# Patient Record
Sex: Female | Born: 1965 | Race: Black or African American | Hispanic: No | Marital: Single | Smoking: Never smoker
Health system: Southern US, Community
[De-identification: ages and names within clinical notes are randomized; demographics above are authoritative.]

## PROBLEM LIST (undated history)

## (undated) DIAGNOSIS — F419 Anxiety disorder, unspecified: Secondary | ICD-10-CM

## (undated) DIAGNOSIS — I10 Essential (primary) hypertension: Secondary | ICD-10-CM

---

## 2020-10-13 ENCOUNTER — Other Ambulatory Visit: Payer: Self-pay

## 2020-10-13 ENCOUNTER — Encounter: Payer: Self-pay | Admitting: Emergency Medicine

## 2020-10-13 ENCOUNTER — Ambulatory Visit
Admission: EM | Admit: 2020-10-13 | Discharge: 2020-10-13 | Disposition: A | Discharge status: Home / Self Care | Payer: 59 | Attending: Orthopedic Surgery | Admitting: Orthopedic Surgery

## 2020-10-13 DIAGNOSIS — Z79899 Other long term (current) drug therapy: Secondary | ICD-10-CM | POA: Insufficient documentation

## 2020-10-13 DIAGNOSIS — I10 Essential (primary) hypertension: Secondary | ICD-10-CM | POA: Insufficient documentation

## 2020-10-13 DIAGNOSIS — Z880 Allergy status to penicillin: Secondary | ICD-10-CM | POA: Insufficient documentation

## 2020-10-13 DIAGNOSIS — F419 Anxiety disorder, unspecified: Secondary | ICD-10-CM | POA: Diagnosis not present

## 2020-10-13 DIAGNOSIS — R197 Diarrhea, unspecified: Secondary | ICD-10-CM | POA: Insufficient documentation

## 2020-10-13 DIAGNOSIS — Z20822 Contact with and (suspected) exposure to covid-19: Secondary | ICD-10-CM | POA: Insufficient documentation

## 2020-10-13 DIAGNOSIS — R0981 Nasal congestion: Secondary | ICD-10-CM | POA: Insufficient documentation

## 2020-10-13 HISTORY — DX: Anxiety disorder, unspecified: F41.9

## 2020-10-13 HISTORY — DX: Essential (primary) hypertension: I10

## 2020-10-13 MED ORDER — CLARITIN-D 24 HOUR 10-240 MG PO TB24: 1.0000 | ORAL_TABLET | Freq: Every day | ORAL | 0 refills | Status: AC

## 2020-10-13 NOTE — ED Triage Notes (Signed)
Patient states that she ate [place] food yesterday evening and woke up this morning with stomach pain and loose stools.  Patient also reports some sinus drainage.  Patient denies fevers.

## 2020-10-13 NOTE — ED Provider Notes (Signed)
[facility]    CSN: [id number] Arrival date & time: 10/13/20  6433      History   Chief Complaint Chief Complaint  Patient presents with  . Abdominal Pain  . Sinus Problem    HPI Kelsey Frazier is a 54 y.o. female presents to the urgent care facility for evaluation of abdominal pain/diarrhea.  Patient states she had [place] food last night, woke up this morning with some loose stools x2.  Denies any blood, nausea or vomiting.  No fevers.  After 2 episodes of diarrhea she is without any abdominal pain and has been able to tolerate p.o. well this morning.  She is concerned about a possible MSG allergy.  She also complains of some sinus congestion that she has had for years, recently increased.  No fevers, sinus pain.  She uses Flonase as needed.  No cough, chest pain or shortness of breath HPI  Past Medical History:  Diagnosis Date  . Anxiety   . Hypertension     There are no problems to display for this patient.   History reviewed. No pertinent surgical history.  OB History   No obstetric history on file.      Home Medications    Prior to Admission medications   Medication Sig Start Date End Date Taking? Authorizing Provider  busPIRone (BUSPAR) 7.5 MG tablet 1/2 tab BID/prn 09/04/20  Yes [provider]  cetirizine (ZYRTEC) 10 MG tablet Take by mouth. 05/09/20  Yes [provider]  fluticasone (FLONASE) 50 MCG/ACT nasal spray One to two sprays in each nostril once daily. 04/20/14  Yes [provider]  metoprolol succinate (TOPROL-XL) 25 MG 24 hr tablet Take by mouth. 01/31/20  Yes [provider]  loratadine-pseudoephedrine (CLARITIN-D 24 HOUR) 10-240 MG 24 hr tablet Take 1 tablet by mouth daily for 7 days. 10/13/20 10/20/20  Evon Slack, PA-C    Family History History reviewed. No pertinent family history.  Social History Social History   Tobacco Use  . Smoking status: Never Smoker  . Smokeless tobacco: Never  Used  Vaping Use  . Vaping Use: Never used  Substance Use Topics  . Alcohol use: Never  . Drug use: Never     Allergies   Penicillins   Review of Systems Review of Systems  Constitutional: Negative for chills and fever.  HENT: Positive for rhinorrhea. Negative for sinus pressure, sinus pain, sore throat and trouble swallowing.   Respiratory: Negative for shortness of breath.   Cardiovascular: Negative for chest pain.  Gastrointestinal: Positive for diarrhea. Negative for abdominal distention, abdominal pain, blood in stool, constipation, nausea and vomiting.  Genitourinary: Negative for dysuria.  Skin: Negative for rash and wound.  Neurological: Negative for dizziness and headaches.     Physical Exam Triage Vital Signs ED Triage Vitals  Enc Vitals Group     BP 10/13/20 0847 (!) 154/76     Pulse Rate 10/13/20 0847 90     Resp 10/13/20 0847 14     Temp 10/13/20 0847 98.7 F (37.1 C)     Temp Source 10/13/20 0847 Oral     SpO2 10/13/20 0847 100 %     Weight 10/13/20 0842 162 lb (73.5 kg)     Height 10/13/20 0842 5' (1.524 m)     Head Circumference --      Peak Flow --      Pain Score 10/13/20 0842 0     Pain Loc --      Pain  Edu? --      Excl. in GC? --    No data found.  Updated Vital Signs BP (!) 154/76 (BP Location: Right Arm)   Pulse 90   Temp 98.7 F (37.1 C) (Oral)   Resp 14   Ht 5' (1.524 m)   Wt 162 lb (73.5 kg)   SpO2 100%   BMI 31.64 kg/m   Visual Acuity Right Eye Distance:   Left Eye Distance:   Bilateral Distance:    Right Eye Near:   Left Eye Near:    Bilateral Near:     Physical Exam Constitutional:      Appearance: She is well-developed.  HENT:     Head: Normocephalic and atraumatic.     Comments: No sinus tenderness with palpation.    Mouth/Throat:     Pharynx: No oropharyngeal exudate or posterior oropharyngeal erythema.  Eyes:     Conjunctiva/sclera: Conjunctivae normal.  Cardiovascular:     Rate and Rhythm: Normal rate.       Pulses: Normal pulses.     Heart sounds: Normal heart sounds. No murmur heard.   Pulmonary:     Effort: Pulmonary effort is normal. No respiratory distress.  Abdominal:     General: Abdomen is flat. Bowel sounds are normal. There is no distension.     Tenderness: There is no abdominal tenderness. There is no guarding.  Musculoskeletal:        General: Normal range of motion.     Cervical back: Normal range of motion.  Skin:    General: Skin is warm.     Findings: No rash.  Neurological:     Mental Status: She is alert and oriented to person, place, and time.  Psychiatric:        Behavior: Behavior normal.        Thought Content: Thought content normal.      UC Treatments / Results  Labs (all labs ordered are listed, but only abnormal results are displayed) Labs Reviewed  SARS CORONAVIRUS 2 (TAT 6-24 HRS)    EKG   Radiology No results found.  Procedures Procedures (including critical care time)  Medications Ordered in UC Medications - No data to display  Initial Impression / Assessment and Plan / UC Course  I have reviewed the triage vital signs and the nursing notes.  Pertinent labs & imaging results that were available during my care of the patient were reviewed by me and considered in my medical decision making (see chart for details).    54 year old female with a few episodes of diarrhea this morning with abdominal cramping after eating [place] food last night.  She is not having any fevers or blood in her stool.  She has had resolution of her abdominal discomfort and diarrhea.  No nausea or vomiting.  Otherwise feeling well.  Educated patient that we did not do MSG allergy testing here.  I reassured her that her history, exam does not seem to be consistent with any infectious process and we educated her on signs and symptoms to return to the urgent care or ER for.  In regards to her chronic nasal congestion that has increased recently.  I did put her on a  Claritin-D tablet for 1 week.  She understands signs symptoms return to the clinic for Final Clinical Impressions(s) / UC Diagnoses   Final diagnoses:  Diarrhea, unspecified type  Sinus congestion     Discharge Instructions     Please take Claritin-D as prescribed  for 7 days.  Make sure you are drinking lots of fluids.  Return to the ER for any fevers worsening pain, worsening diarrhea nausea vomiting or any urgent changes in your health   ED Prescriptions    Medication Sig Dispense Auth. Provider   loratadine-pseudoephedrine (CLARITIN-D 24 HOUR) 10-240 MG 24 hr tablet Take 1 tablet by mouth daily for 7 days. 7 tablet Evon Slack, PA-C     PDMP not reviewed this encounter.   Evon Slack, [place] 10/13/20 [phone]

## 2020-10-13 NOTE — Discharge Instructions (Addendum)
Please take Claritin-D as prescribed for 7 days.  Make sure you are drinking lots of fluids.  Return to the ER for any fevers worsening pain, worsening diarrhea nausea vomiting or any urgent changes in your health

## 2020-10-14 LAB — SARS CORONAVIRUS 2 (TAT 6-24 HRS): SARS Coronavirus 2: NEGATIVE

## 2020-10-17 ENCOUNTER — Telehealth: Payer: Self-pay

## 2020-10-17 NOTE — Telephone Encounter (Signed)
Negative COVID results given. Patient results "NOT Detected." Caller expressed understanding. ° °

## 2020-11-12 ENCOUNTER — Emergency Department: Payer: 59

## 2020-11-12 ENCOUNTER — Emergency Department
Admission: EM | Admit: 2020-11-12 | Discharge: 2020-11-13 | Disposition: A | Discharge status: Home / Self Care | Payer: 59 | Attending: Emergency Medicine | Admitting: Emergency Medicine

## 2020-11-12 ENCOUNTER — Encounter: Payer: Self-pay | Admitting: Emergency Medicine

## 2020-11-12 ENCOUNTER — Other Ambulatory Visit: Payer: Self-pay

## 2020-11-12 ENCOUNTER — Ambulatory Visit
Admission: EM | Admit: 2020-11-12 | Discharge: 2020-11-12 | Disposition: A | Discharge status: Home / Self Care | Payer: 59 | Attending: Emergency Medicine | Admitting: Emergency Medicine

## 2020-11-12 ENCOUNTER — Encounter: Payer: Self-pay | Admitting: *Deleted

## 2020-11-12 DIAGNOSIS — R509 Fever, unspecified: Secondary | ICD-10-CM | POA: Diagnosis not present

## 2020-11-12 DIAGNOSIS — Z20822 Contact with and (suspected) exposure to covid-19: Secondary | ICD-10-CM | POA: Diagnosis not present

## 2020-11-12 DIAGNOSIS — Z79899 Other long term (current) drug therapy: Secondary | ICD-10-CM | POA: Insufficient documentation

## 2020-11-12 DIAGNOSIS — R002 Palpitations: Secondary | ICD-10-CM | POA: Diagnosis not present

## 2020-11-12 DIAGNOSIS — R739 Hyperglycemia, unspecified: Secondary | ICD-10-CM | POA: Diagnosis not present

## 2020-11-12 DIAGNOSIS — R319 Hematuria, unspecified: Secondary | ICD-10-CM | POA: Diagnosis not present

## 2020-11-12 DIAGNOSIS — I10 Essential (primary) hypertension: Secondary | ICD-10-CM | POA: Diagnosis not present

## 2020-11-12 DIAGNOSIS — R9431 Abnormal electrocardiogram [ECG] [EKG]: Secondary | ICD-10-CM | POA: Diagnosis not present

## 2020-11-12 LAB — URINALYSIS, COMPLETE (UACMP) WITH MICROSCOPIC
Bacteria, UA: NONE SEEN
Bilirubin Urine: NEGATIVE
Glucose, UA: NEGATIVE mg/dL
Ketones, ur: NEGATIVE mg/dL
Leukocytes,Ua: NEGATIVE
Nitrite: NEGATIVE
Protein, ur: NEGATIVE mg/dL
Specific Gravity, Urine: 1.012 (ref 1.005–1.030)
pH: 5 (ref 5.0–8.0)

## 2020-11-12 LAB — CBC
HCT: 41.6 % (ref 36.0–46.0)
Hemoglobin: 13.9 g/dL (ref 12.0–15.0)
MCH: 30.3 pg (ref 26.0–34.0)
MCHC: 33.4 g/dL (ref 30.0–36.0)
MCV: 90.6 fL (ref 80.0–100.0)
Platelets: 263 10*3/uL (ref 150–400)
RBC: 4.59 MIL/uL (ref 3.87–5.11)
RDW: 12.5 % (ref 11.5–15.5)
WBC: 8.5 10*3/uL (ref 4.0–10.5)
nRBC: 0 % (ref 0.0–0.2)

## 2020-11-12 LAB — BASIC METABOLIC PANEL
Anion gap: 9 (ref 5–15)
BUN: 9 mg/dL (ref 6–20)
CO2: 25 mmol/L (ref 22–32)
Calcium: 9.1 mg/dL (ref 8.9–10.3)
Chloride: 103 mmol/L (ref 98–111)
Creatinine, Ser: 0.6 mg/dL (ref 0.44–1.00)
GFR, Estimated: >60 mL/min (ref >60)
Glucose, Bld: 140 mg/dL — ABNORMAL HIGH (ref 70–99)
Potassium: 3.5 mmol/L (ref 3.5–5.1)
Sodium: 137 mmol/L (ref 135–145)

## 2020-11-12 LAB — TROPONIN I (HIGH SENSITIVITY): Troponin I (High Sensitivity): 3 ng/L (ref <18)

## 2020-11-12 MED ORDER — ACETAMINOPHEN 500 MG PO TABS
1000.0000 mg | ORAL_TABLET | Freq: Once | ORAL | Status: AC
  Administered 2020-11-12: 1000 mg via ORAL

## 2020-11-12 MED ORDER — ACETAMINOPHEN 500 MG PO TABS
ORAL_TABLET | ORAL | Status: AC
  Filled 2020-11-12: qty 2

## 2020-11-12 NOTE — ED Provider Notes (Signed)
[facility]    CSN: [id number] Arrival date & time: 11/12/20  1854      History   Chief Complaint Chief Complaint  Patient presents with   Palpitations    patient currently not having palpitations    HPI Kelsey Frazier is a 54 y.o. female.   HPI   54 year old female here for evaluation of palpitations.  Patient reports that she is had palpitations all day.  She states that she has not slept in the past 4 days.  Patient states that she talk to her PCP who told her to take half a tablet metoprolol this morning and then another half in the evening but she wanted to check with [place] to make sure that was okay.  Patient has been referred to cardiology but has not yet seen them.  Patient denies any chest pain or shortness of breath.  No syncope.  Past Medical History:  Diagnosis Date   Anxiety    Anxiety    Hypertension     There are no problems to display for this patient.   History reviewed. No pertinent surgical history.  OB History   No obstetric history on file.      Home Medications    Prior to Admission medications   Medication Sig Start Date End Date Taking? Authorizing Provider  busPIRone (BUSPAR) 7.5 MG tablet 1/2 tab BID/prn 09/04/20  Yes [provider]  cetirizine (ZYRTEC) 10 MG tablet Take by mouth. 05/09/20  Yes [provider]  fluticasone (FLONASE) 50 MCG/ACT nasal spray One to two sprays in each nostril once daily. 04/20/14  Yes [provider]  metoprolol succinate (TOPROL-XL) 25 MG 24 hr tablet Take by mouth. 01/31/20  Yes [provider]    Family History History reviewed. No pertinent family history.  Social History Social History   Tobacco Use   Smoking status: Never Smoker   Smokeless tobacco: Never Used  Building services engineer Use: Never used  Substance Use Topics   Alcohol use: Never   Drug use: Never     Allergies   Penicillins   Review of Systems Review of Systems    Constitutional: Negative for activity change, diaphoresis and fatigue.  Respiratory: Negative for shortness of breath and wheezing.   Cardiovascular: Negative for chest pain.  Gastrointestinal: Negative for nausea and vomiting.  Musculoskeletal: Negative for arthralgias and myalgias.  Skin: Negative for rash.  Neurological: Negative for dizziness, syncope and numbness.  Psychiatric/Behavioral: Negative.      Physical Exam Triage Vital Signs ED Triage Vitals  Enc Vitals Group     BP 11/12/20 1939 136/84     Pulse Rate 11/12/20 1939 (!) 116     Resp --      Temp --      Temp src --      SpO2 11/12/20 1939 99 %     Weight 11/12/20 1937 165 lb (74.8 kg)     Height 11/12/20 1937 5' (1.524 m)     Head Circumference --      Peak Flow --      Pain Score 11/12/20 1937 0     Pain Loc --      Pain Edu? --      Excl. in GC? --    No data found.  Updated Vital Signs BP 136/84 (BP Location: Right Arm)    Pulse (!) 116    Ht 5' (1.524 m)    Wt 165 lb (74.8 kg)  SpO2 99%    BMI 32.22 kg/m   Visual Acuity Right Eye Distance:   Left Eye Distance:   Bilateral Distance:    Right Eye Near:   Left Eye Near:    Bilateral Near:     Physical Exam Constitutional:      General: She is not in acute distress. HENT:     Head: Normocephalic and atraumatic.  Eyes:     General: No scleral icterus.    Extraocular Movements: Extraocular movements intact.     Conjunctiva/sclera: Conjunctivae normal.  Cardiovascular:     Rate and Rhythm: Regular rhythm. Tachycardia present.     Pulses: Normal pulses.     Heart sounds: Normal heart sounds. No murmur heard.   Pulmonary:     Effort: Pulmonary effort is normal.     Breath sounds: Normal breath sounds. No wheezing or rales.  Musculoskeletal:        General: Swelling present. No tenderness.  Skin:    General: Skin is warm and dry.     Capillary Refill: Capillary refill takes less than 2 seconds.  Neurological:     General: No focal  deficit present.     Mental Status: She is alert and oriented to person, place, and time.  Psychiatric:        Mood and Affect: Mood normal.        Behavior: Behavior normal.        Thought Content: Thought content normal.        Judgment: Judgment normal.      UC Treatments / Results  Labs (all labs ordered are listed, but only abnormal results are displayed) Labs Reviewed - No data to display  EKG   Radiology No results found.  Procedures Procedures (including critical care time)  Medications Ordered in UC Medications - No data to display  Initial Impression / Assessment and Plan / UC Course  I have reviewed the triage vital signs and the nursing notes.  Pertinent labs & imaging results that were available during my care of the patient were reviewed by me and considered in my medical decision making (see chart for details).   Patient here for evaluation of palpitations that have been going on all day.  Patient denies chest pain or shortness of breath, no syncope or diaphoresis.  EKG shows ST elevation in V1 V2 and V3 with reciprocal changes and T wave inversions in 2 3 and aVF.  Patient denies any chest pain.  Discussed need for cardiac evaluation and monitoring in the ER.  Discussed with patient that I would prefer she go in the back of an ambulance and patient is refusing.  Patient would prefer to go by POV because she has her grandchildren with her.  Patient will sign out AMA and transport herself to the emergency department.  Patient had checked in and then left one time previously to go pick up her grandchildren prior to evaluation.    Final Clinical Impressions(s) / UC Diagnoses   Final diagnoses:  Palpitations  ST segment changes on electrocardiogram     Discharge Instructions     Please go to the nearest emergency department for a cardiac evaluation given the fact that you have some abdomen normal rhythms on your EKG.    ED Prescriptions    None      PDMP not reviewed this encounter.   Becky Augusta, NP 11/12/20 2007

## 2020-11-12 NOTE — Discharge Instructions (Addendum)
Please go to the nearest emergency department for a cardiac evaluation given the fact that you have some abdomen normal rhythms on your EKG.

## 2020-11-12 NOTE — ED Provider Notes (Signed)
[facility] Emergency Department Provider Note  ____________________________________________  Time seen: Approximately 11:44 PM  I have reviewed the triage vital signs and the nursing notes.   HISTORY  Chief Complaint Palpitations   HPI Kelsey Frazier is a 54 y.o. female the history of anxiety, hypertension, palpitations on metoprolol who presents from urgent care for palpitations.  Patient tells me that she was diagnosed with palpitations from anxiety several months ago.  She was placed on metoprolol as needed.  Yesterday she reports she was feeling well.  She went for a 5 mile walk.  When she came back home she had a whole bag of peanuts that had a lot of salt in it.  This morning she woke up with indigestion and not feeling well.  She noticed that her heart rate was up.  She called her doctor who recommended her going to urgent care for evaluation.  Patient went to urgent care and was sent here for evaluation of palpitations.  Patient denies any fever at home, cough, sore throat, chest pain, shortness of breath, abdominal pain, nausea, vomiting, diarrhea, dysuria or hematuria.  Patient has received her Covid vaccinations.  Denies any known exposures to Covid or influenza.  At this time she reports that she feels back to her baseline.   Past Medical History:  Diagnosis Date  . Anxiety   . Anxiety   . Hypertension      Prior to Admission medications   Medication Sig Start Date End Date Taking? Authorizing Provider  busPIRone (BUSPAR) 7.5 MG tablet 1/2 tab BID/prn 09/04/20   [provider]  cetirizine (ZYRTEC) 10 MG tablet Take by mouth. 05/09/20   [provider]  fluticasone (FLONASE) 50 MCG/ACT nasal spray One to two sprays in each nostril once daily. 04/20/14   [provider]  metoprolol succinate (TOPROL-XL) 25 MG 24 hr tablet Take by mouth. 01/31/20   [provider]    Allergies Penicillins  No family history on  file.  Social History Social History   Tobacco Use  . Smoking status: Never Smoker  . Smokeless tobacco: Never Used  Vaping Use  . Vaping Use: Never used  Substance Use Topics  . Alcohol use: Never  . Drug use: Never    Review of Systems  Constitutional: Negative for fever. Eyes: Negative for visual changes. ENT: Negative for sore throat. Neck: No neck pain  Cardiovascular: Negative for chest pain. + palpitations Respiratory: Negative for shortness of breath. Gastrointestinal: Negative for abdominal pain, vomiting or diarrhea. + indigestion Genitourinary: Negative for dysuria. Musculoskeletal: Negative for back pain. Skin: Negative for rash. Neurological: Negative for headaches, weakness or numbness. Psych: No SI or HI  ____________________________________________   PHYSICAL EXAM:  VITAL SIGNS: Vitals:   11/12/20 2336 11/12/20 2354  BP: 136/87   Pulse: 94   Resp: 14   Temp:  98.9 F (37.2 C)  SpO2: 97%     Constitutional: Alert and oriented. Well appearing and in no apparent distress. HEENT:      Head: Normocephalic and atraumatic.         Eyes: Conjunctivae are normal. Sclera is non-icteric.       Mouth/Throat: Mucous membranes are moist.       Neck: Supple with no signs of meningismus. Cardiovascular: Regular rate and rhythm. No murmurs, gallops, or rubs. 2+ symmetrical distal pulses are present in all extremities. No JVD. Respiratory: Normal respiratory effort. Lungs are clear to auscultation bilaterally. No wheezes, crackles, or rhonchi.  Gastrointestinal: Soft, non tender, and non distended. Musculoskeletal: Nontender with normal range of motion in all extremities. No edema, cyanosis, or erythema of extremities. Neurologic: Normal speech and language. Face is symmetric. Moving all extremities. No gross focal neurologic deficits are appreciated. Skin: Skin is warm, dry and intact. No rash noted. Psychiatric: Mood and affect are normal. Speech and behavior  are normal.  ____________________________________________   LABS (all labs ordered are listed, but only abnormal results are displayed)  Labs Reviewed  BASIC METABOLIC PANEL - Abnormal; Notable for the following components:      Result Value   Glucose, Bld 140 (*)    All other components within normal limits  URINALYSIS, COMPLETE (UACMP) WITH MICROSCOPIC - Abnormal; Notable for the following components:   Color, Urine YELLOW (*)    APPearance HAZY (*)    Hgb urine dipstick MODERATE (*)    All other components within normal limits  RESP PANEL BY RT PCR (RSV, FLU A&B, COVID)  CBC  TROPONIN I (HIGH SENSITIVITY)  TROPONIN I (HIGH SENSITIVITY)   ____________________________________________  EKG  ED ECG REPORT I, Nita Sickle, the attending physician, personally viewed and interpreted this ECG.  Sinus tachycardia, rate of 112, normal intervals, normal axis, diffuse T wave flattening with no ST elevations.  No prior for comparison. ____________________________________________  RADIOLOGY  I have personally reviewed the images performed during this visit and I agree with the Radiologist's read.   Interpretation by Radiologist:  DG Chest 2 View  Result Date: 11/12/2020 CLINICAL DATA:  Heart palpitations EXAM: CHEST - 2 VIEW COMPARISON:  None. FINDINGS: No consolidation, features of edema, pneumothorax, or effusion. Pulmonary vascularity is normally distributed. The cardiomediastinal contours are unremarkable. No acute osseous or soft tissue abnormality. IMPRESSION: No acute cardiopulmonary abnormality. Electronically Signed   By: Kreg Shropshire M.D.   On: 11/12/2020 21:20     ____________________________________________   PROCEDURES  Procedure(s) performed:yes .1-3 Lead EKG Interpretation Performed by: Nita Sickle, MD Authorized by: Nita Sickle, MD     Interpretation: normal     ECG rate assessment: normal     Rhythm: sinus rhythm     Critical Care  performed:  None ____________________________________________   INITIAL IMPRESSION / ASSESSMENT AND PLAN / ED COURSE  54 y.o. female the history of anxiety, hypertension, palpitations on metoprolol who presents from urgent care for palpitations in the setting of having indigestion this morning.  Patient is well-appearing, with a fever of 101.58F but no localizing symptoms.  She has normal work of breathing and normal sats, lungs are clear, abdomen is soft and nontender.  Patient is vaccinated against Covid.  Patient was not even aware that she had a fever before arriving to the emergency room.  Chest x-ray visualized by me with no signs of pneumonia, confirmed by radiology.  Initially tachycardic in the setting of fever however that has resolved after Tylenol.  EKG showing sinus tachycardia with no dysrhythmias.  Patient placed on telemetry with no signs of dysrhythmias as well.  No leukocytosis.  BMP showing mildly elevated glucose of 140 with no history of diabetes.  This is nonfasting was discussed with patient.  Recommended follow-up with PCP for recheck.  Troponin negative x1 therefore low suspicion for myocarditis.  Covid and flu are pending.  Urinalysis pending although patient is asymptomatic.  At this time no signs of sepsis.  Doubt PE with no chest pain or shortness of breath.  Patient is requesting to be discharged and called with the results of her  repeat troponin, respiratory panel, and urinalysis since her family and grandkids are in the car waiting for her.  I have urged patient to wait for the results but she is adamant about leaving.  At this time patient looks well, feels back to baseline, no longer has a fever and has no signs of sepsis.  Will discharge her and call her within the next hour with her results.  Old medical records reviewed including most recent visit to urgent care earlier today including the EKG that they were concerned about and sent patient here for evaluation.  Do not see  any evidence of ST elevation on the EKG.  Discussed close follow-up with PCP and my standard return precautions.     _________________________ 2:01 AM on 11/13/2020 -----------------------------------------  Viral panel negative.  Second troponin negative.  UA with hematuria which was discussed with patient and recommended outpatient follow-up.  _____________________________________________ Please note:  Patient was evaluated in Emergency Department today for the symptoms described in the history of present illness. Patient was evaluated in the context of the global COVID-19 pandemic, which necessitated consideration that the patient might be at risk for infection with the SARS-CoV-2 virus that causes COVID-19. Institutional protocols and algorithms that pertain to the evaluation of patients at risk for COVID-19 are in a state of rapid change based on information released by regulatory bodies including the CDC and federal and state organizations. These policies and algorithms were followed during the patient's care in the ED.  Some ED evaluations and interventions may be delayed as a result of limited staffing during the pandemic.   [place] Controlled Substance Database was reviewed by me. ____________________________________________   FINAL CLINICAL IMPRESSION(S) / ED DIAGNOSES   Final diagnoses:  Fever, unspecified fever cause  Hyperglycemia  Hematuria, unspecified type      NEW MEDICATIONS STARTED DURING THIS VISIT:  ED Discharge Orders    None       Note:  This document was prepared using Dragon voice recognition software and may include unintentional dictation errors.    Nita Sickle, MD 11/13/20 [phone]

## 2020-11-12 NOTE — ED Triage Notes (Signed)
Called patient x 2. No answer on her phone. She returned to urgent care and stated she had to leave to go get her grandson after she checked in to be seen.   Patient states she ate some peanut butter crackers last night and states this made her stomach hurt and her heart have palpitations. She states she hasn't slept in 4 days. Her PCP told her to take half a tablet of Metoprolol this morning and he told her to take another half this evening but she wanted to check with the doctor here to make sure that was ok. Her PCP has scheduled her an appointment with a cardiologist.

## 2020-11-12 NOTE — ED Triage Notes (Signed)
Pt ambulatory to triage.  Pt sent from urgent care for eval of heart palpitations   No chest pain or sob.  Pt not sleeping past 4 days.  Pt alert  Speech clear.

## 2020-11-13 ENCOUNTER — Telehealth: Payer: Self-pay | Admitting: Emergency Medicine

## 2020-11-13 ENCOUNTER — Emergency Department: Admission: EM | Admit: 2020-11-13 | Discharge: 2020-11-13 | Discharge status: Against Medical Advice | Payer: 59

## 2020-11-13 LAB — RESP PANEL BY RT PCR (RSV, FLU A&B, COVID)
Influenza A by PCR: NEGATIVE
Influenza B by PCR: NEGATIVE
Respiratory Syncytial Virus by PCR: NEGATIVE
SARS Coronavirus 2 by RT PCR: NEGATIVE

## 2020-11-13 LAB — TROPONIN I (HIGH SENSITIVITY): Troponin I (High Sensitivity): 3 ng/L (ref <18)

## 2020-11-13 NOTE — Telephone Encounter (Signed)
Patient called because she said she was told she would get a call about her results last night and never got one.  I explained her results to her. Advised her to follow up with pcp to see if any further testing is needed.  She has already contacted her pcp, and they are arranging cardiology follow up.

## 2020-11-13 NOTE — Discharge Instructions (Signed)
As we discussed you are found to have a fever in the emergency room.  Your Covid and flu swab are still pending.  We will call you back with those results.  Your blood glucose was slightly elevated at 140.  Make sure to follow-up with your doctor to have this rechecked.  You also have a little bit of blood in your urine.  Make sure to discuss that with your primary care doctor as well for reevaluation.  You may take Tylenol 1000 mg every 8 hours as needed for fevers.  Follow-up with your primary care doctor.  Return to the emergency room for chest pain, shortness of breath, or abdominal pain.

## 2020-12-29 ENCOUNTER — Other Ambulatory Visit: Payer: Self-pay

## 2020-12-29 ENCOUNTER — Emergency Department
Admission: EM | Admit: 2020-12-29 | Discharge: 2020-12-29 | Disposition: A | Discharge status: Home / Self Care | Payer: 59 | Attending: Emergency Medicine | Admitting: Emergency Medicine

## 2020-12-29 ENCOUNTER — Encounter: Payer: Self-pay | Admitting: Emergency Medicine

## 2020-12-29 DIAGNOSIS — R638 Other symptoms and signs concerning food and fluid intake: Secondary | ICD-10-CM | POA: Insufficient documentation

## 2020-12-29 DIAGNOSIS — Z5321 Procedure and treatment not carried out due to patient leaving prior to being seen by health care provider: Secondary | ICD-10-CM | POA: Insufficient documentation

## 2020-12-29 LAB — CBG MONITORING, ED: Glucose-Capillary: 109 mg/dL — ABNORMAL HIGH (ref 70–99)

## 2020-12-29 NOTE — ED Triage Notes (Signed)
Pt reports she keeps getting hungry every 3-4 hours and this has never happened to her before. Pt states that if she doesn't eat she feels faint. Pt states, she is eating more than she ever has. Pt reports she has seen her MD about it but he told her to just eat and pt would like a second opinion.

## 2020-12-29 NOTE — ED Triage Notes (Signed)
Pt called from [facility] top treatment room, no response
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
## 2021-08-07 IMAGING — CR DG CHEST 2V
2 series · 2 of 2 positions shown · non-contrast
Comparison: None.

CLINICAL DATA: Heart palpitations

EXAM:
CHEST - 2 VIEW

[chest pa]
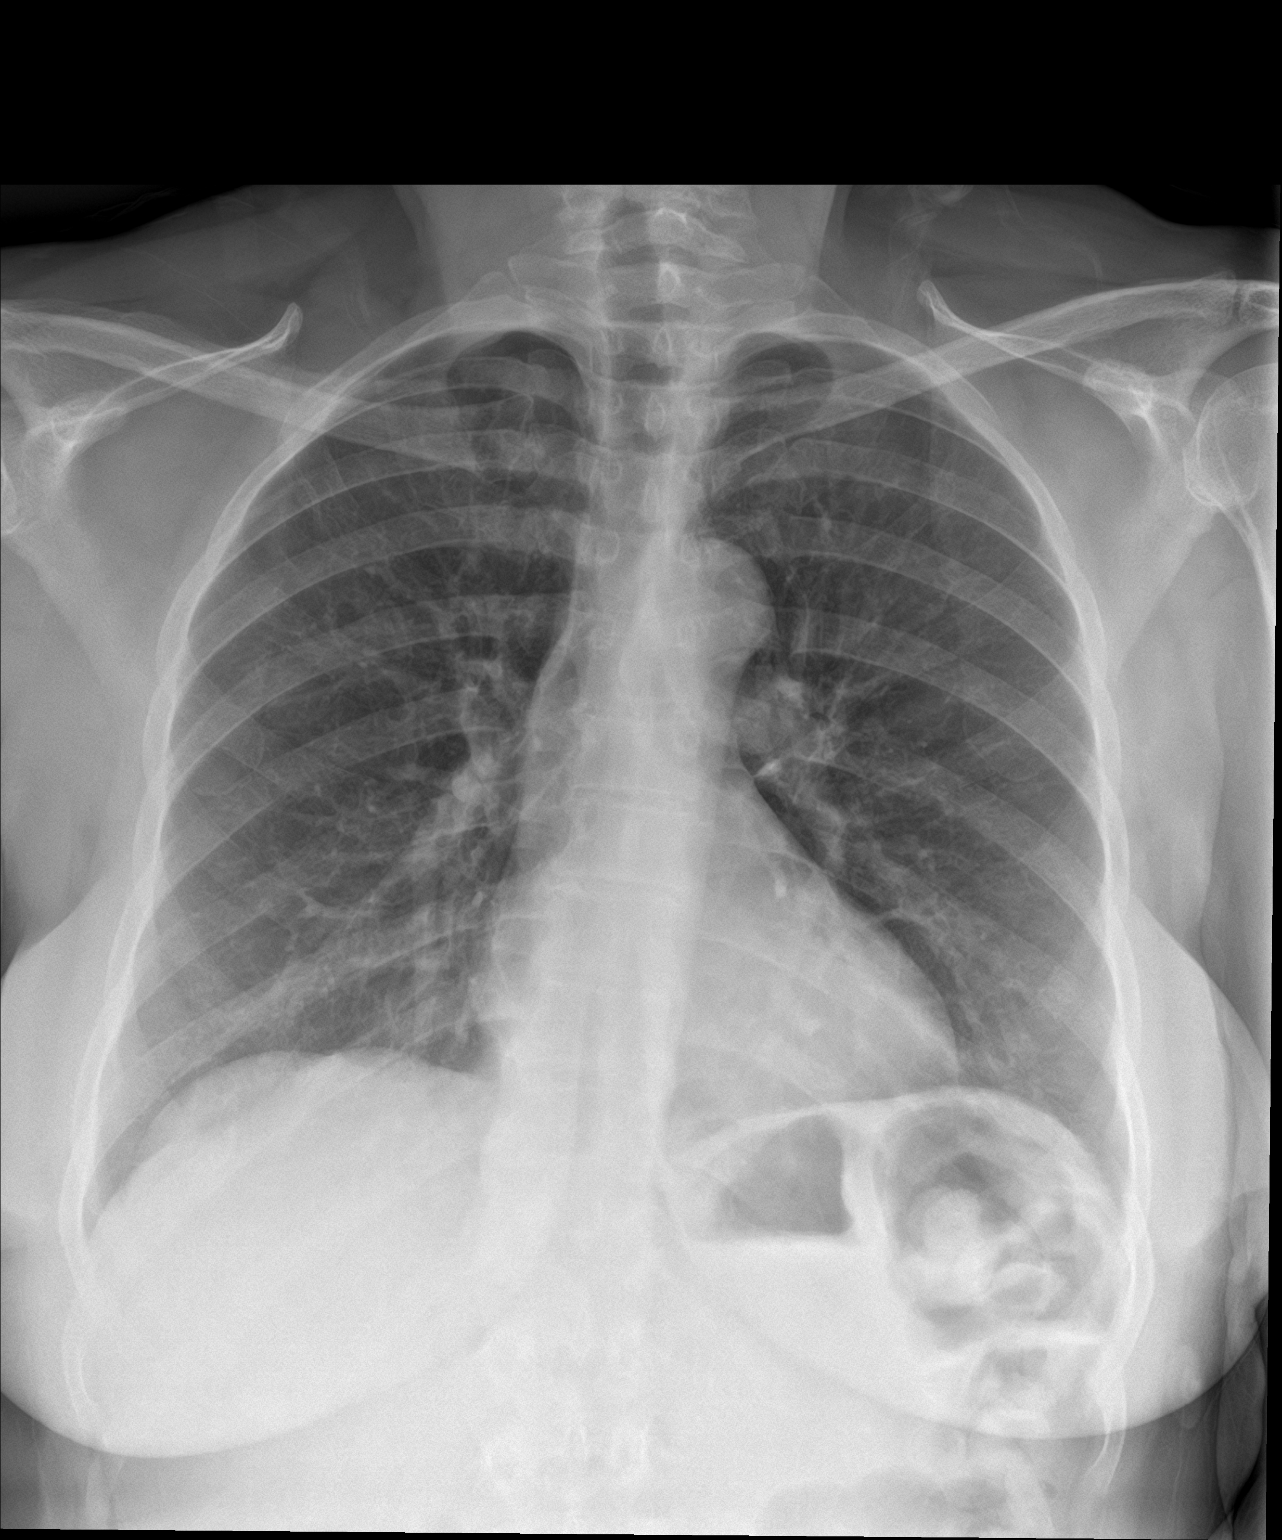

[chest lat]
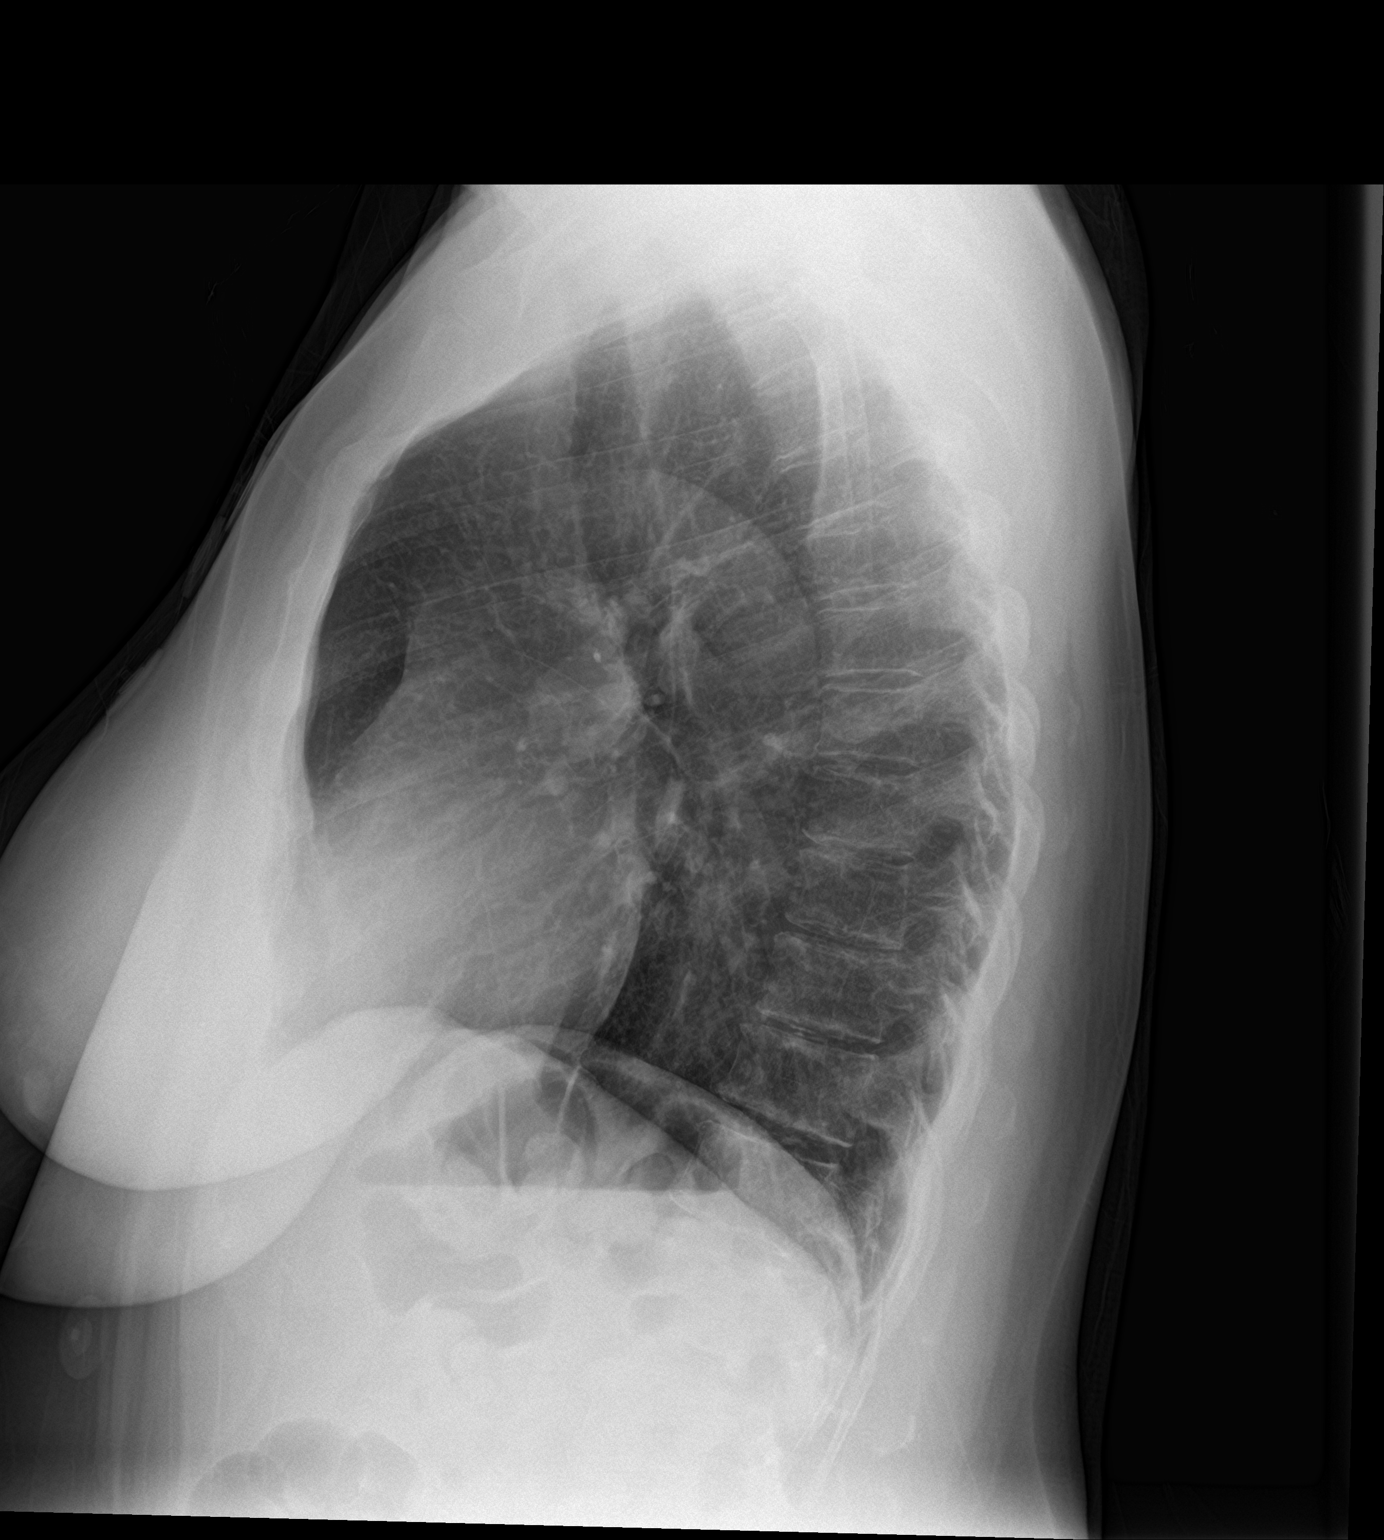

[2 of 2 positions shown; findings below may reference images not displayed]

FINDINGS: No consolidation, features of edema, pneumothorax, or effusion.
Pulmonary vascularity is normally distributed. The cardiomediastinal
contours are unremarkable. No acute osseous or soft tissue
abnormality.
IMPRESSION: No acute cardiopulmonary abnormality.

## 2024-01-19 ENCOUNTER — Emergency Department: Payer: 59

## 2024-01-19 ENCOUNTER — Other Ambulatory Visit: Payer: Self-pay

## 2024-01-19 DIAGNOSIS — R0789 Other chest pain: Secondary | ICD-10-CM | POA: Diagnosis not present

## 2024-01-19 DIAGNOSIS — I10 Essential (primary) hypertension: Secondary | ICD-10-CM | POA: Insufficient documentation

## 2024-01-19 DIAGNOSIS — M542 Cervicalgia: Secondary | ICD-10-CM | POA: Insufficient documentation

## 2024-01-19 DIAGNOSIS — S0990XA Unspecified injury of head, initial encounter: Secondary | ICD-10-CM | POA: Insufficient documentation

## 2024-01-19 DIAGNOSIS — M545 Low back pain, unspecified: Secondary | ICD-10-CM | POA: Diagnosis not present

## 2024-01-19 DIAGNOSIS — Y92410 Unspecified street and highway as the place of occurrence of the external cause: Secondary | ICD-10-CM | POA: Insufficient documentation

## 2024-01-19 DIAGNOSIS — Z79899 Other long term (current) drug therapy: Secondary | ICD-10-CM | POA: Diagnosis not present

## 2024-01-19 NOTE — ED Notes (Signed)
Patient placed in C collar due to tenderness on palpation and mechanism.
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
## 2024-01-20 ENCOUNTER — Emergency Department
Admission: EM | Admit: 2024-01-20 | Discharge: 2024-01-20 | Disposition: A | Discharge status: Home / Self Care | Payer: 59 | Attending: Emergency Medicine | Admitting: Emergency Medicine

## 2024-01-20 DIAGNOSIS — S0990XA Unspecified injury of head, initial encounter: Secondary | ICD-10-CM

## 2024-01-20 MED ORDER — IBUPROFEN 800 MG PO TABS
800.0000 mg | ORAL_TABLET | Freq: Once | ORAL | Status: AC
  Administered 2024-01-20: 800 mg via ORAL
  Filled 2024-01-20: qty 1

## 2024-01-20 MED ORDER — IBUPROFEN 800 MG PO TABS: 800.0000 mg | ORAL_TABLET | Freq: Three times a day (TID) | ORAL | 0 refills | Status: DC | PRN

## 2024-01-20 MED ORDER — METHOCARBAMOL 500 MG PO TABS: 500.0000 mg | ORAL_TABLET | Freq: Three times a day (TID) | ORAL | 0 refills | Status: DC | PRN

## 2024-01-20 NOTE — Discharge Instructions (Addendum)
You may alternate Tylenol 1000 mg every 6 hours as needed for pain, fever and Ibuprofen 800 mg every 6-8 hours as needed for pain, fever.  Please take Ibuprofen with food.  Do not take more than 4000 mg of Tylenol (acetaminophen) in a 24 hour period. ° °
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
## 2025-02-01 ENCOUNTER — Ambulatory Visit: Payer: Self-pay
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
## 2025-02-02 ENCOUNTER — Ambulatory Visit: Admitting: Family Medicine

## 2025-02-02 ENCOUNTER — Encounter: Payer: Self-pay | Admitting: Family Medicine

## 2025-02-02 ENCOUNTER — Telehealth: Payer: Self-pay | Admitting: Family Medicine

## 2025-02-02 VITALS — BP 138/76 | HR 86 | Temp 97.8°F | Resp 16 | Ht 60.5 in | Wt 173.8 lb

## 2025-02-02 DIAGNOSIS — N281 Cyst of kidney, acquired: Secondary | ICD-10-CM | POA: Insufficient documentation

## 2025-02-02 DIAGNOSIS — G8929 Other chronic pain: Secondary | ICD-10-CM | POA: Insufficient documentation

## 2025-02-02 DIAGNOSIS — Z7689 Persons encountering health services in other specified circumstances: Secondary | ICD-10-CM
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
## 2025-02-02 NOTE — Patient Instructions (Addendum)
 [facility] [address] [place], [place] [address]   MyChart:  For all urgent or time sensitive needs we ask that you please call the office to avoid delays. Our number is [phone] [id number]. MyChart is not constantly monitored and due to the large volume of messages a day, replies may take up to 72 business hours.   MyChart Policy: MyChart allows for you to see your visit notes, after visit summary, provider recommendations, lab and tests results, make an appointment, request refills, and contact your provider or the office for non-urgent questions or concerns. Providers are seeing patients during normal business hours and do not have built in time to review MyChart messages.  We ask that you allow a minimum of 3 business days for responses to [place]. For this reason, please do not send urgent requests through MyChart. Please call the office at [phone]. New and ongoing conditions may require a visit. We have virtual and in person visit available for your convenience.  Complex MyChart concerns may require a visit. Your provider may request you schedule a virtual or in person visit to ensure we are providing the best care possible. MyChart messages sent after 11:00 AM on Friday will not be received by the provider until Monday morning.    Lab and Test Results: You will receive your lab and test results on MyChart as soon as they are completed and results have been sent by the lab or testing facility. Due to this service, you will receive your results BEFORE your provider.  I review lab and tests results each morning prior to seeing patients. Some results require collaboration with other providers to ensure you are receiving the most appropriate care. For this reason, we ask that you please allow a minimum of 3-5 business days from the time the ALL results have been received for your provider to receive and review lab and test results and contact you about these.  Most  lab and test result comments from the provider will be sent through MyChart. Your provider may recommend changes to the plan of care, follow-up visits, repeat testing, ask questions, or request an office visit to discuss these results. You may reply directly to this message or call the office at [phone] to provide information for the provider or set up an appointment. In some instances, you will be called with test results and recommendations. Please let us  know if this is preferred and we will make note of this in your chart to provide this for you.    If you have not heard a response to your lab or test results in 5 business days from all results returning to MyChart, please call the office to let us  know. We ask that you please avoid calling prior to this time unless there is an emergent concern. Due to high call volumes, this can delay the resulting process.   After Hours: For all non-emergency after hours needs, please call the office at [phone] and select the option to reach the on-call provider service. On-call services are shared between multiple [facility] offices and therefore it will not be possible to speak directly with your provider. On-call providers may provide medical advice and recommendations, but are unable to provide refills for maintenance medications.  For all emergency or urgent medical needs after normal business hours, we recommend that you seek care at the closest Urgent Care or Emergency Department to ensure appropriate treatment in a timely manner.  [facility]  at [facility] has a 24 hour emergency room located on the ground floor for your convenience.    Urgent Concerns During the Business Day Providers are seeing patients from 8AM to 5PM, Monday through Thursday, and 8AM to 12PM on Friday with a busy schedule and are most often not able to respond to non-urgent calls until the end of the day or the next business day. If you should have URGENT concerns  during the day, please call and speak to the nurse or schedule a same day appointment so that we can address your concern without delay.    Thank you, again, for choosing me as your health care partner. I appreciate your trust and look forward to learning more about you.    Kelsey Arts, FNP-C

## 2025-02-02 NOTE — Telephone Encounter (Signed)
 LVM for pt. That the medical records release form is incomplete; no doctor / clinic information was provided on the form. Please call to provide.

## 2025-03-02 ENCOUNTER — Ambulatory Visit: Admitting: Family Medicine

## 2025-03-16 ENCOUNTER — Ambulatory Visit: Admitting: Family Medicine
